# Patient Record
Sex: Male | Born: 1955 | Race: Black or African American | Hispanic: No | Marital: Single | Smoking: Former smoker
Health system: Southern US, Community
[De-identification: ages and names within clinical notes are randomized; demographics above are authoritative.]

## PROBLEM LIST (undated history)

## (undated) DIAGNOSIS — I609 Nontraumatic subarachnoid hemorrhage, unspecified: Secondary | ICD-10-CM

## (undated) HISTORY — DX: Nontraumatic subarachnoid hemorrhage, unspecified: I60.9

---

## 2014-06-14 ENCOUNTER — Emergency Department (HOSPITAL_COMMUNITY)
Admission: EM | Admit: 2014-06-14 | Discharge: 2014-06-14 | Disposition: A | Discharge status: Home / Self Care | Payer: Self-pay | Attending: Emergency Medicine | Admitting: Emergency Medicine

## 2014-06-14 ENCOUNTER — Emergency Department (HOSPITAL_COMMUNITY): Payer: Self-pay

## 2014-06-14 ENCOUNTER — Encounter (HOSPITAL_COMMUNITY): Payer: Self-pay | Admitting: *Deleted

## 2014-06-14 DIAGNOSIS — Z87891 Personal history of nicotine dependence: Secondary | ICD-10-CM | POA: Insufficient documentation

## 2014-06-14 DIAGNOSIS — Y998 Other external cause status: Secondary | ICD-10-CM | POA: Insufficient documentation

## 2014-06-14 DIAGNOSIS — S0101XA Laceration without foreign body of scalp, initial encounter: Secondary | ICD-10-CM | POA: Insufficient documentation

## 2014-06-14 DIAGNOSIS — Y9389 Activity, other specified: Secondary | ICD-10-CM | POA: Insufficient documentation

## 2014-06-14 DIAGNOSIS — Y92007 Garden or yard of unspecified non-institutional (private) residence as the place of occurrence of the external cause: Secondary | ICD-10-CM | POA: Insufficient documentation

## 2014-06-14 DIAGNOSIS — Z23 Encounter for immunization: Secondary | ICD-10-CM | POA: Insufficient documentation

## 2014-06-14 DIAGNOSIS — S51812A Laceration without foreign body of left forearm, initial encounter: Secondary | ICD-10-CM | POA: Insufficient documentation

## 2014-06-14 MED ORDER — CEFTRIAXONE SODIUM 1 G IJ SOLR
1.0000 g | Freq: Once | INTRAMUSCULAR | Status: AC
  Administered 2014-06-14: 1 g via INTRAMUSCULAR
  Filled 2014-06-14: qty 10

## 2014-06-14 MED ORDER — LIDOCAINE HCL (PF) 1 % IJ SOLN
INTRAMUSCULAR | Status: AC
  Administered 2014-06-14: 2.1 mL
  Filled 2014-06-14: qty 5

## 2014-06-14 MED ORDER — CEPHALEXIN 500 MG PO CAPS: 1000.0000 mg | ORAL_CAPSULE | Freq: Two times a day (BID) | ORAL | Status: DC

## 2014-06-14 MED ORDER — POVIDONE-IODINE 10 % EX SOLN
CUTANEOUS | Status: AC
  Administered 2014-06-14: 09:00:00
  Filled 2014-06-14: qty 118

## 2014-06-14 MED ORDER — TETANUS-DIPHTH-ACELL PERTUSSIS 5-2.5-18.5 LF-MCG/0.5 IM SUSP
0.5000 mL | Freq: Once | INTRAMUSCULAR | Status: AC
  Administered 2014-06-14: 0.5 mL via INTRAMUSCULAR
  Filled 2014-06-14: qty 0.5

## 2014-06-14 MED ORDER — HYDROCODONE-ACETAMINOPHEN 5-325 MG PO TABS: 1.0000 | ORAL_TABLET | ORAL | Status: DC | PRN

## 2014-06-14 MED ORDER — LIDOCAINE-EPINEPHRINE (PF) 1 %-1:200000 IJ SOLN
10.0000 mL | Freq: Once | INTRAMUSCULAR | Status: AC
  Administered 2014-06-14: 10 mL
  Filled 2014-06-14: qty 10
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
## 2014-06-14 NOTE — Discharge Instructions (Signed)
Delayed Wound Closure Sometimes, your health care provider will decide to delay closing a wound for several days. This is done when the wound is badly bruised, dirty, or when it has been several hours since the injury happened. By delaying the closure of your wound, the risk of infection is reduced. Wounds that are closed in 3-7 days after being cleaned up and dressed heal just as well as those that are closed right away. HOME CARE INSTRUCTIONS  Rest and elevate the injured area until the pain and swelling are gone.  Have your wound checked as instructed by your health care provider. SEEK MEDICAL CARE IF: Laceration Care, Adult A laceration is a cut or lesion that goes through all layers of the skin and into the tissue just beneath the skin. TREATMENT  Some lacerations may not require closure. Some lacerations may not be able to be closed due to an increased risk of infection. It is important to see your caregiver as soon as possible after an injury to minimize the risk of infection and maximize the opportunity for successful closure. If closure is appropriate, pain medicines may be given, if needed. The wound will be cleaned to help prevent infection. Your caregiver will use stitches (sutures), staples, wound glue (adhesive), or skin adhesive strips to repair the laceration. These tools bring the skin edges together to allow for faster healing and a better cosmetic outcome. However, all wounds will heal with a scar. Once the wound has healed, scarring can be minimized by covering the wound with sunscreen during the day for 1 full year. HOME CARE INSTRUCTIONS  For sutures or staples:  Keep the wound clean and dry.  If you were given a bandage (dressing), you should change it at least once a day. Also, change the dressing if it becomes wet or dirty, or as directed by your caregiver.  Wash the wound with soap and water 2 times a day. Rinse the wound off with water to remove all soap. Pat the wound  dry with a clean towel.  After cleaning, apply a thin layer of the antibiotic ointment as recommended by your caregiver. This will help prevent infection and keep the dressing from sticking.  You may shower as usual after the first 24 hours. Do not soak the wound in water until the sutures are removed.  Only take over-the-counter or prescription medicines for pain, discomfort, or fever as directed by your caregiver.  Get your sutures or staples removed as directed by your caregiver. For skin adhesive strips:  Keep the wound clean and dry.  Do not get the skin adhesive strips wet. You may bathe carefully, using caution to keep the wound dry.  If the wound gets wet, pat it dry with a clean towel.  Skin adhesive strips will fall off on their own. You may trim the strips as the wound heals. Do not remove skin adhesive strips that are still stuck to the wound. They will fall off in time. For wound adhesive:  You may briefly wet your wound in the shower or bath. Do not soak or scrub the wound. Do not swim. Avoid periods of heavy perspiration until the skin adhesive has fallen off on its own. After showering or bathing, gently pat the wound dry with a clean towel.  Do not apply liquid medicine, cream medicine, or ointment medicine to your wound while the skin adhesive is in place. This may loosen the film before your wound is healed.  If a dressing is  placed over the wound, be careful not to apply tape directly over the skin adhesive. This may cause the adhesive to be pulled off before the wound is healed.  Avoid prolonged exposure to sunlight or tanning lamps while the skin adhesive is in place. Exposure to ultraviolet light in the first year will darken the scar.  The skin adhesive will usually remain in place for 5 to 10 days, then naturally fall off the skin. Do not pick at the adhesive film. You may need a tetanus shot if:  You cannot remember when you had your last tetanus shot.  You  have never had a tetanus shot. If you get a tetanus shot, your arm may swell, get red, and feel warm to the touch. This is common and not a problem. If you need a tetanus shot and you choose not to have one, there is a rare chance of getting tetanus. Sickness from tetanus can be serious. SEEK MEDICAL CARE IF:   You have redness, swelling, or increasing pain in the wound.  You see a red line that goes away from the wound.  You have yellowish-white fluid (pus) coming from the wound.  You have a fever.  You notice a bad smell coming from the wound or dressing.  Your wound breaks open before or after sutures have been removed.  You notice something coming out of the wound such as wood or glass.  Your wound is on your hand or foot and you cannot move a finger or toe. SEEK IMMEDIATE MEDICAL CARE IF:   Your pain is not controlled with prescribed medicine.  You have severe swelling around the wound causing pain and numbness or a change in color in your arm, hand, leg, or foot.  Your wound splits open and starts bleeding.  You have worsening numbness, weakness, or loss of function of any joint around or beyond the wound.  You develop painful lumps near the wound or on the skin anywhere on your body. MAKE SURE YOU:   Understand these instructions.  Will watch your condition.  Will get help right away if you are not doing well or get worse. Document Released: 12/21/2004 Document Revised: 03/15/2011 Document Reviewed: 06/16/2010 [facility] Patient Information 2015 [place], [place]. This information is not intended to replace advice given to you by your health care provider. Make sure you discuss any questions you have with your health care provider.   You develop unusual or increased swelling or redness around the wound.  You have increasing pain or tenderness.  There is increasing fluid (drainage) or a bad smelling drainage coming from the wound. Document Released: 12/21/2004 Document  Revised: 12/26/2012 Document Reviewed: 06/20/2012 [facility] Patient Information 2015 [place], [place]. This information is not intended to replace advice given to you by your health care provider. Make sure you discuss any questions you have with your health care provider.  Take your entire course of antibodies prescribed.  Do not drive or operate any injuries equipment within 4 hours of taking the pain medication as this will make you sleepy.  As discussed, return here in 2 days for recheck of your injuries.  You may need to have your stitches changed at that time or if there are signs of infection you may need further treatment.

## 2014-06-14 NOTE — ED Notes (Signed)
Patient with no complaints at this time. Respirations even and unlabored. Skin warm/dry. Discharge instructions reviewed with patient at this time. Patient given opportunity to voice concerns/ask questions. Patient discharged at this time and left Emergency Department with steady gait.   

## 2014-06-16 ENCOUNTER — Encounter (HOSPITAL_COMMUNITY): Payer: Self-pay | Admitting: Emergency Medicine

## 2014-06-16 ENCOUNTER — Emergency Department (HOSPITAL_COMMUNITY)
Admission: EM | Admit: 2014-06-16 | Discharge: 2014-06-16 | Disposition: A | Discharge status: Home / Self Care | Payer: Self-pay | Attending: Emergency Medicine | Admitting: Emergency Medicine

## 2014-06-16 DIAGNOSIS — Z5189 Encounter for other specified aftercare: Secondary | ICD-10-CM

## 2014-06-16 DIAGNOSIS — Z792 Long term (current) use of antibiotics: Secondary | ICD-10-CM | POA: Insufficient documentation

## 2014-06-16 DIAGNOSIS — Z4801 Encounter for change or removal of surgical wound dressing: Secondary | ICD-10-CM | POA: Insufficient documentation

## 2014-06-16 DIAGNOSIS — Z87891 Personal history of nicotine dependence: Secondary | ICD-10-CM | POA: Insufficient documentation

## 2014-06-16 MED ORDER — CEPHALEXIN 500 MG PO CAPS
500.0000 mg | ORAL_CAPSULE | Freq: Once | ORAL | Status: AC
  Administered 2014-06-16: 500 mg via ORAL
  Filled 2014-06-16: qty 1

## 2014-06-16 NOTE — ED Provider Notes (Signed)
CSN: [id number]     Arrival date & time 06/16/14  1527 History   First MD Initiated Contact with Patient 06/16/14 1614     Chief Complaint  Patient presents with  . Wound Check     (Consider location/radiation/quality/duration/timing/severity/associated sxs/prior Treatment) HPI Barnet Mancil is a 59 y.o. male who presents to the ED for wound check. He reports that the area is still a little sore but less swollen and less painful. Denies other problems.   History reviewed. No pertinent past medical history. History reviewed. No pertinent past surgical history. History reviewed. No pertinent family history. History  Substance Use Topics  . Smoking status: Former Smoker    Types: Cigarettes    Quit date: 06/16/2011  . Smokeless tobacco: Not on file  . Alcohol Use: Yes     Comment: 3 beers daily    Review of Systems Negative except as stated in HPI   Allergies  Review of patient's allergies indicates no known allergies.  Home Medications   Prior to Admission medications   Medication Sig Start Date End Date Taking? Authorizing Provider  cephALEXin (KEFLEX) 500 MG capsule Take 2 capsules (1,000 mg total) by mouth 2 (two) times daily. 06/14/14   Burgess Amor, PA-C  HYDROcodone-acetaminophen (NORCO/VICODIN) 5-325 MG per tablet Take 1 tablet by mouth every 4 (four) hours as needed. 06/14/14   Burgess Amor, PA-C   BP 122/64 mmHg  Pulse 88  Temp(Src) 98.9 F (37.2 C) (Oral)  Resp 22  SpO2 99% Physical Exam  Constitutional: He is oriented to person, place, and time. He appears well-developed and well-nourished.  HENT:  Head: Normocephalic.  Staples in place left scalp area without signs of infection.   Eyes: EOM are normal.  Neck: Neck supple.  Cardiovascular: Normal rate.   Pulmonary/Chest: Effort normal.  Musculoskeletal: Normal range of motion.  Sutures in place left forearm, there is swelling surrounding the wound but no red streaking or increased warmth. I had J. Idol,  PA in to see the patient since she saw him initially and closed the wound 2 days ago. She states the wound has improved very well.   Neurological: He is alert and oriented to person, place, and time. No cranial nerve deficit.  Skin: Skin is warm and dry.  Psychiatric: He has a normal mood and affect. His behavior is normal.  Nursing note and vitals reviewed.   ED Course  Procedures (including critical care time) Labs Review  MDM  59 y.o. male here for wound recheck for 2 days prior. Stable for d/c without red streaking, fever or signs of infection. Encouraged patient to fill the Rx for his antibiotics and he agrees. He will return as scheduled for suture and staple removal or sooner for any problems.   Final diagnoses:  Encounter for wound re-check      Janne Napoleon, NP 06/16/14 1634  Raeford Razor, MD 06/17/14 1535
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
## 2014-06-16 NOTE — Discharge Instructions (Signed)
Be sure to get the antibiotic filled. Return in 7 days for suture removal. Return sooner for any problems.

## 2014-06-24 ENCOUNTER — Emergency Department (HOSPITAL_COMMUNITY)
Admission: EM | Admit: 2014-06-24 | Discharge: 2014-06-24 | Disposition: A | Discharge status: Home / Self Care | Payer: Self-pay

## 2014-06-24 NOTE — ED Notes (Signed)
No answer

## 2014-06-26 ENCOUNTER — Emergency Department (HOSPITAL_COMMUNITY)
Admission: EM | Admit: 2014-06-26 | Discharge: 2014-06-26 | Disposition: A | Discharge status: Home / Self Care | Payer: Self-pay | Attending: Emergency Medicine | Admitting: Emergency Medicine

## 2014-06-26 ENCOUNTER — Encounter (HOSPITAL_COMMUNITY): Payer: Self-pay | Admitting: Emergency Medicine

## 2014-06-26 DIAGNOSIS — Z87891 Personal history of nicotine dependence: Secondary | ICD-10-CM | POA: Insufficient documentation

## 2014-06-26 DIAGNOSIS — Z792 Long term (current) use of antibiotics: Secondary | ICD-10-CM | POA: Insufficient documentation

## 2014-06-26 DIAGNOSIS — L089 Local infection of the skin and subcutaneous tissue, unspecified: Secondary | ICD-10-CM | POA: Insufficient documentation

## 2014-06-26 DIAGNOSIS — Z4802 Encounter for removal of sutures: Secondary | ICD-10-CM | POA: Insufficient documentation

## 2014-06-26 DIAGNOSIS — T798XXA Other early complications of trauma, initial encounter: Secondary | ICD-10-CM

## 2014-06-26 NOTE — Discharge Instructions (Signed)
You need to make sure that you take the antibiotics to help the wound heal Staple Care and Removal Your caregiver has used staples today to repair your wound. Staples are used to help a wound heal faster by holding the edges of the wound together. The staples can be removed when the wound has healed well enough to stay together after the staples are removed. A dressing (wound covering), depending on the location of the wound, may have been applied. This may be changed once per day or as instructed. If the dressing sticks, it may be soaked off with soapy water or hydrogen peroxide. Only take over-the-counter or prescription medicines for pain, discomfort, or fever as directed by your caregiver.  If you did not receive a tetanus shot today because you did not recall when your last one was given, check with your caregiver when you have your staples removed to determine if one is needed. Return to your caregiver's office in 1 week or as suggested to have your staples removed. SEEK IMMEDIATE MEDICAL CARE IF:   You have redness, swelling, or increasing pain in the wound.  You have pus coming from the wound.  You have a fever.  You notice a bad smell coming from the wound or dressing.  Your wound edges break open after staples have been removed. Document Released: 09/15/2000 Document Revised: 03/15/2011 Document Reviewed: 09/30/2004 [facility] Patient Information 2015 [place], [place]. This information is not intended to replace advice given to you by your health care provider. Make sure you discuss any questions you have with your health care provider.

## 2014-06-26 NOTE — ED Notes (Signed)
Pt here for suture removal

## 2014-06-26 NOTE — ED Provider Notes (Signed)
CSN: [id number]     Arrival date & time 06/26/14  1525 History   First MD Initiated Contact with Patient 06/26/14 1538     Chief Complaint  Patient presents with  . Suture / Staple Removal     (Consider location/radiation/quality/duration/timing/severity/associated sxs/prior Treatment) HPI Comments: Pt states that he is here to have suture and staple removal. He had them placed 10 days ago. He states that his left forearm looks swollen. He hasn't been taking his antibiotics. He has staple in his scalp. Denies problem in the area.  The history is provided by the patient. No language interpreter was used.    History reviewed. No pertinent past medical history. History reviewed. No pertinent past surgical history. History reviewed. No pertinent family history. History  Substance Use Topics  . Smoking status: Former Smoker    Types: Cigarettes    Quit date: 06/16/2011  . Smokeless tobacco: Not on file  . Alcohol Use: Yes     Comment: 3 beers daily    Review of Systems  All other systems reviewed and are negative.     Allergies  Review of patient's allergies indicates no known allergies.  Home Medications   Prior to Admission medications   Medication Sig Start Date End Date Taking? Authorizing Provider  cephALEXin (KEFLEX) 500 MG capsule Take 2 capsules (1,000 mg total) by mouth 2 (two) times daily. 06/14/14   Burgess Amor, PA-C  HYDROcodone-acetaminophen (NORCO/VICODIN) 5-325 MG per tablet Take 1 tablet by mouth every 4 (four) hours as needed. 06/14/14   Burgess Amor, PA-C   BP 133/83 mmHg  Pulse 105  Temp(Src) 98.4 F (36.9 C) (Oral)  Resp 18  Ht 6' (1.829 m)  Wt 165 lb (74.844 kg)  BMI 22.37 kg/m2  SpO2 100% Physical Exam  Constitutional: He appears well-developed and well-nourished.  HENT:  Clean staples to the scalp. No drainage noted  Cardiovascular: Normal rate and regular rhythm.   Pulmonary/Chest: Effort normal and breath sounds normal.  Skin:  Wound to the  left forearm without redness or pus. Small fluctuance noted  Nursing note and vitals reviewed.   ED Course  SUTURE REMOVAL Date/Time: 06/26/2014 4:20 PM Performed by: Teressa Lower Authorized by: Teressa Lower Consent given by: patient Patient identity confirmed: verbally with patient Body area: head/neck Location details: scalp Staples Removed: 4 Facility: sutures placed in this facility Patient tolerance: Patient tolerated the procedure well with no immediate complications  SUTURE REMOVAL Date/Time: 06/26/2014 4:20 PM Performed by: Teressa Lower Authorized by: Teressa Lower Consent given by: patient Patient identity confirmed: verbally with patient Time out: Immediately prior to procedure a "time out" was called to verify the correct patient, procedure, equipment, support staff and site/side marked as required. Wound Appearance: draining Sutures Removed: 2 Post-removal: dressing applied Facility: sutures placed in this facility Patient tolerance: Patient tolerated the procedure well with no immediate complications   (including critical care time) Labs Review Labs Reviewed - No data to display  Imaging Review No results found.   EKG Interpretation None      MDM   Final diagnoses:  Visit for suture removal  Removal of staple  Infected wound, initial encounter    Forearm wound not well healed instructed pt to take the keflex that was given to him at the time the sutures were placed    Teressa Lower, NP 06/26/14 1622  Raeford Razor, MD 06/27/14 [phone]

## 2016-12-11 IMAGING — DX DG FOREARM 2V*L*
2 series · 2 of 2 positions shown · non-contrast
Comparison: None.

CLINICAL DATA: Forearm laceration.

EXAM:
LEFT FOREARM - 2 VIEW

[forearm ap]
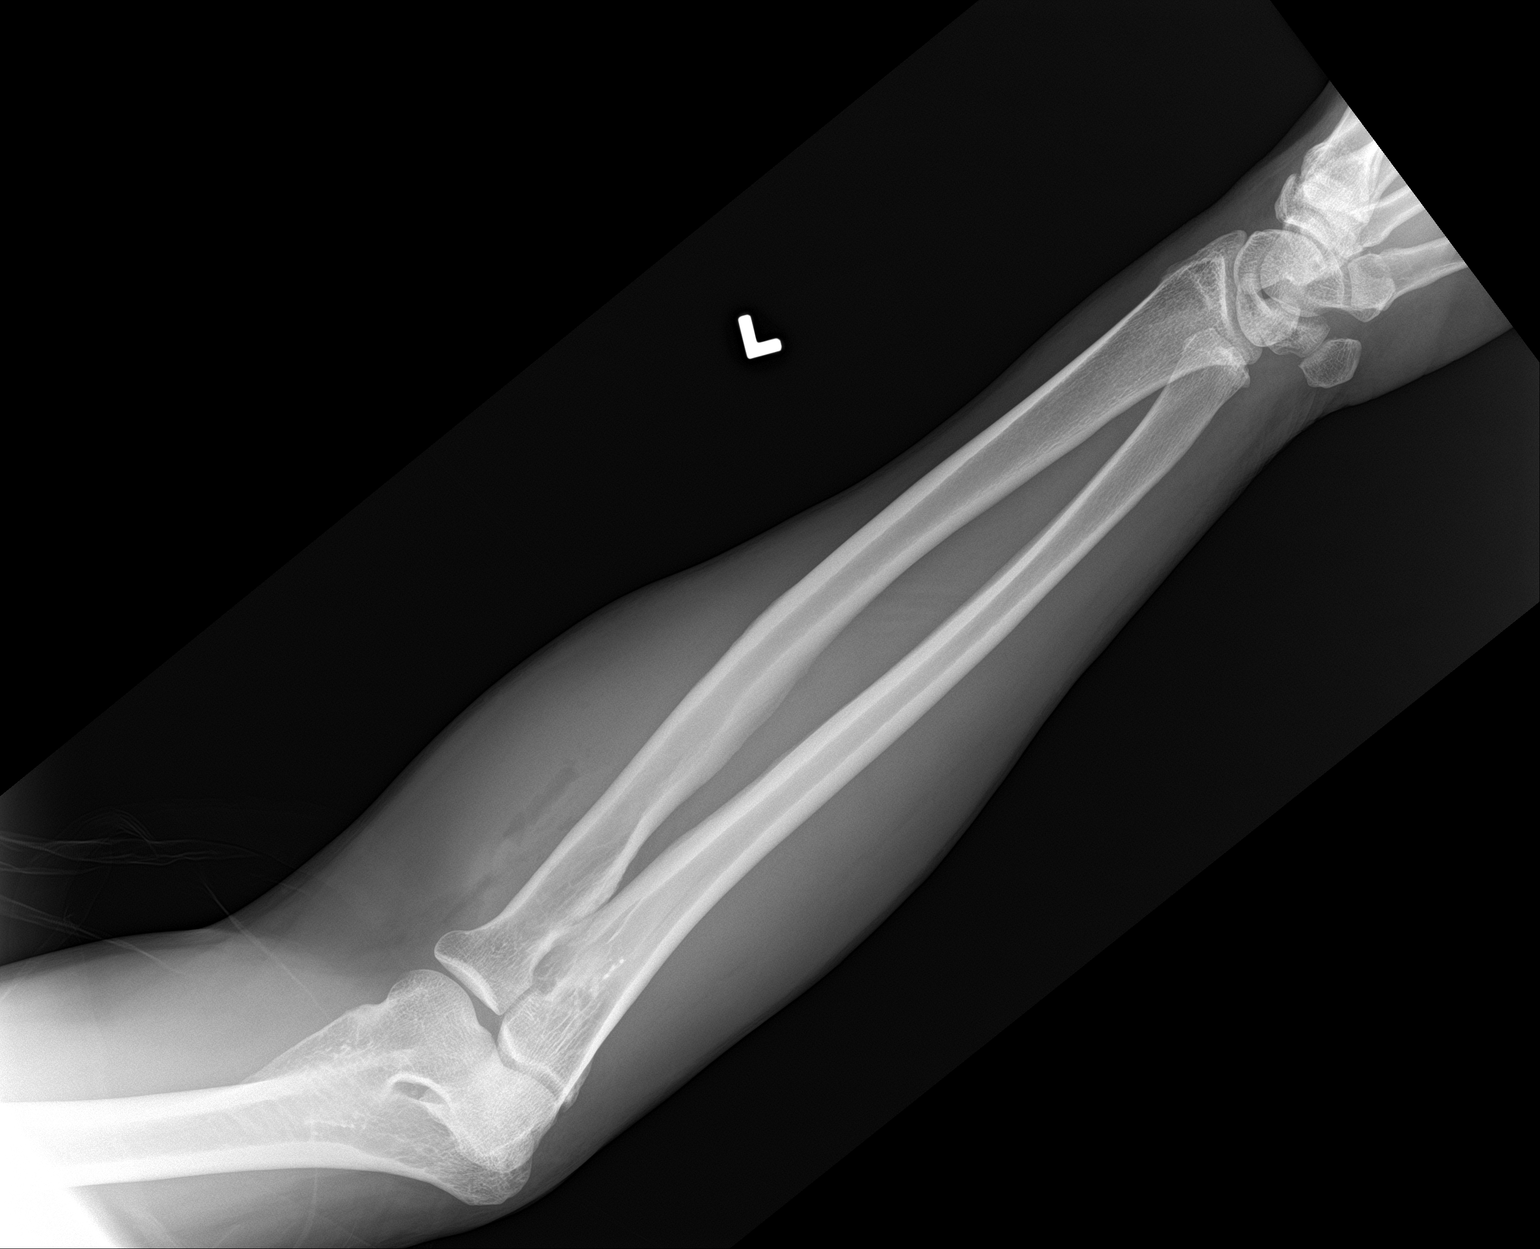

[forearm lat]
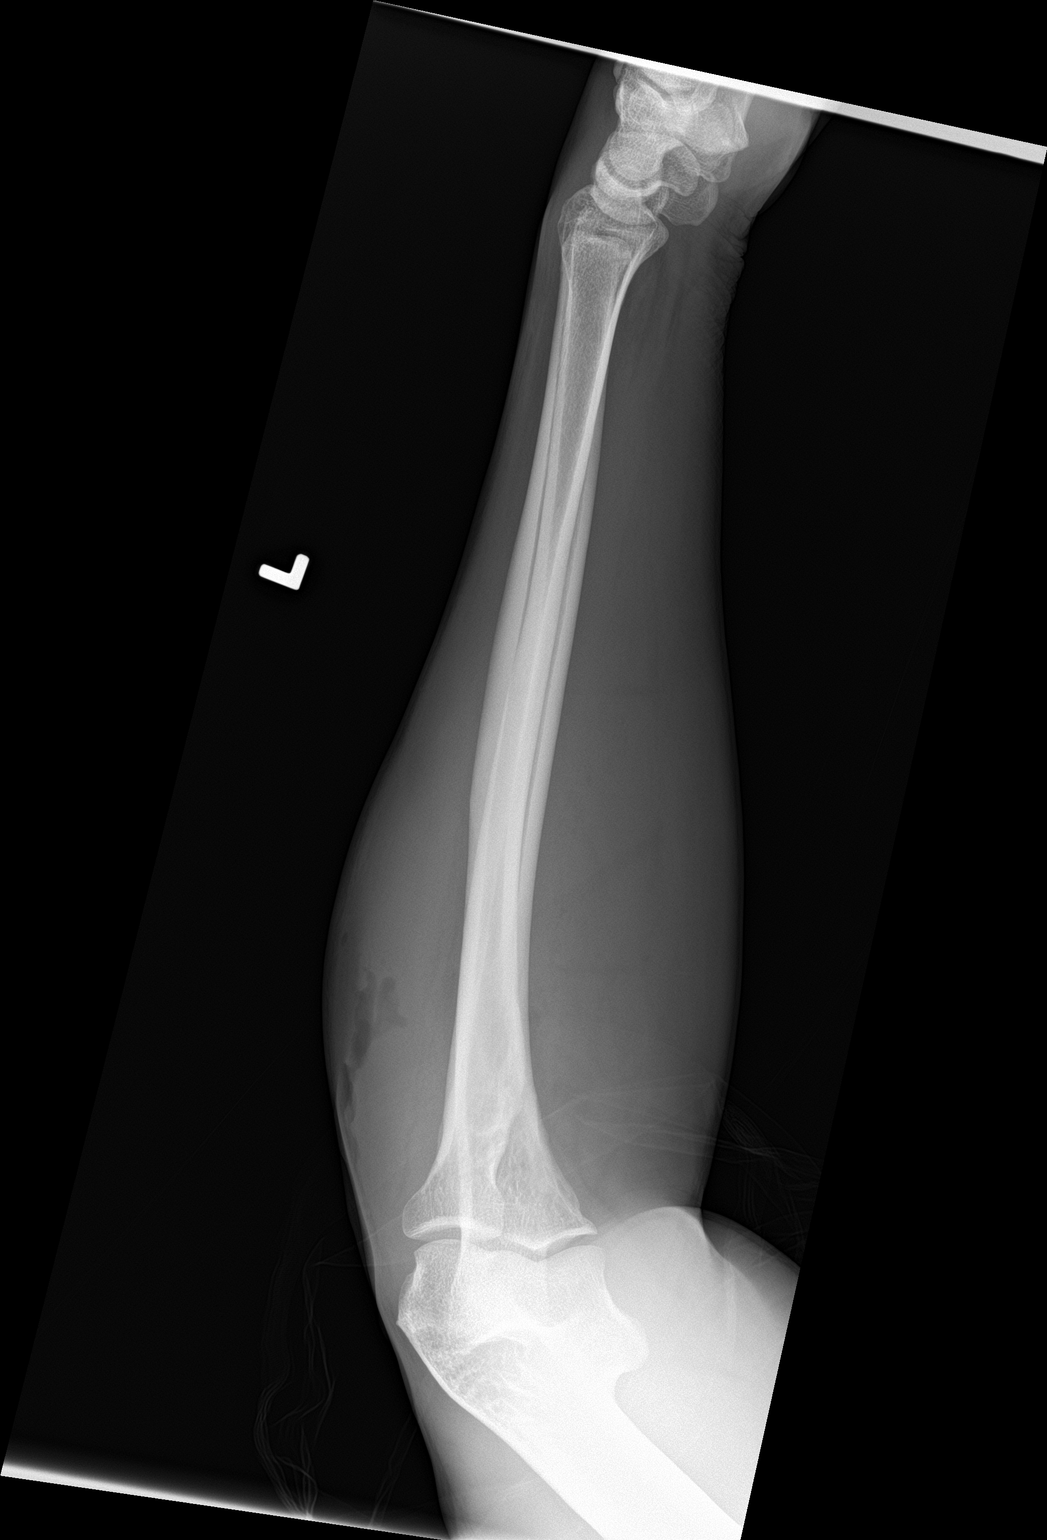

[2 of 2 positions shown; findings below may reference images not displayed]

FINDINGS: Soft tissue injury noted of the proximal forearm posteriorly.
Laceration is present. Soft tissue air noted. No acute bony
abnormality. No radiopaque foreign body.
IMPRESSION: Soft tissue laceration posterior proximal forearm. No acute bony
abnormality. No radiopaque foreign body.

## 2022-11-12 ENCOUNTER — Encounter: Payer: Self-pay | Admitting: Internal Medicine

## 2022-11-12 ENCOUNTER — Ambulatory Visit: Payer: Medicare Other | Admitting: Internal Medicine

## 2022-11-12 VITALS — BP 144/80 | HR 83 | Ht 73.0 in | Wt 161.4 lb

## 2022-11-12 DIAGNOSIS — M25572 Pain in left ankle and joints of left foot: Secondary | ICD-10-CM | POA: Diagnosis not present

## 2022-11-12 DIAGNOSIS — I10 Essential (primary) hypertension: Secondary | ICD-10-CM | POA: Diagnosis not present

## 2022-11-12 DIAGNOSIS — G8929 Other chronic pain: Secondary | ICD-10-CM | POA: Insufficient documentation

## 2022-11-12 DIAGNOSIS — Z114 Encounter for screening for human immunodeficiency virus [HIV]: Secondary | ICD-10-CM

## 2022-11-12 DIAGNOSIS — N401 Enlarged prostate with lower urinary tract symptoms: Secondary | ICD-10-CM

## 2022-11-12 DIAGNOSIS — Z1159 Encounter for screening for other viral diseases: Secondary | ICD-10-CM

## 2022-11-12 DIAGNOSIS — E559 Vitamin D deficiency, unspecified: Secondary | ICD-10-CM

## 2022-11-12 DIAGNOSIS — E782 Mixed hyperlipidemia: Secondary | ICD-10-CM

## 2022-11-12 DIAGNOSIS — R351 Nocturia: Secondary | ICD-10-CM

## 2022-11-12 DIAGNOSIS — S066X0D Traumatic subarachnoid hemorrhage without loss of consciousness, subsequent encounter: Secondary | ICD-10-CM | POA: Diagnosis not present

## 2022-11-12 DIAGNOSIS — R739 Hyperglycemia, unspecified: Secondary | ICD-10-CM

## 2022-11-12 DIAGNOSIS — S066X0A Traumatic subarachnoid hemorrhage without loss of consciousness, initial encounter: Secondary | ICD-10-CM | POA: Insufficient documentation

## 2022-11-12 DIAGNOSIS — F1010 Alcohol abuse, uncomplicated: Secondary | ICD-10-CM | POA: Insufficient documentation

## 2022-11-12 MED ORDER — TAMSULOSIN HCL 0.4 MG PO CAPS: 0.4000 mg | ORAL_CAPSULE | Freq: Every day | ORAL | 3 refills | Status: AC

## 2022-11-12 MED ORDER — AMLODIPINE BESYLATE 5 MG PO TABS: 5.0000 mg | ORAL_TABLET | Freq: Every day | ORAL | 2 refills | Status: AC

## 2022-11-12 NOTE — Assessment & Plan Note (Signed)
BP Readings from Last 1 Encounters:  11/12/22 (!) 144/80   Uncontrolled, new onset Started amlodipine 5 mg QD Counseled for compliance with the medications Advised DASH diet and moderate exercise/walking, at least 150 mins/week

## 2022-11-12 NOTE — Patient Instructions (Signed)
Please start taking Amlodipine for blood pressure.  Please start taking Tamsulosin for urinary symptoms.
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
## 2022-11-12 NOTE — Assessment & Plan Note (Signed)
Needs to cut down alcohol to reduce fall risk, he expressed understanding Advised to take B complex vitamin supplement

## 2022-11-12 NOTE — Assessment & Plan Note (Signed)
Left ankle pain likely due to overexertion or prolonged standing, could be ankle arthritis Trial of Tylenol arthritis as needed for pain Leg elevation as tolerated If persistent, will get x-ray of ankle

## 2022-11-12 NOTE — Assessment & Plan Note (Signed)
Has chronic urinary urgency and nocturia, likely due to BPH Check PSA Started tamsulosin 0.4 mg qHS

## 2022-11-13 LAB — CMP14+EGFR
ALT: 22 [IU]/L (ref 0–44)
AST: 56 [IU]/L — ABNORMAL HIGH (ref 0–40)
Albumin: 4.2 g/dL (ref 3.9–4.9)
Alkaline Phosphatase: 95 [IU]/L (ref 44–121)
BUN/Creatinine Ratio: 14 (ref 10–24)
BUN: 12 mg/dL (ref 8–27)
Bilirubin Total: 0.3 mg/dL (ref 0.0–1.2)
CO2: 22 mmol/L (ref 20–29)
Calcium: 8.9 mg/dL (ref 8.6–10.2)
Chloride: 104 mmol/L (ref 96–106)
Creatinine, Ser: 0.86 mg/dL (ref 0.76–1.27)
Globulin, Total: 3.2 g/dL (ref 1.5–4.5)
Glucose: 95 mg/dL (ref 70–99)
Potassium: 4.5 mmol/L (ref 3.5–5.2)
Sodium: 139 mmol/L (ref 134–144)
Total Protein: 7.4 g/dL (ref 6.0–8.5)
eGFR: 96 mL/min/{1.73_m2} (ref >59)

## 2022-11-13 LAB — LIPID PANEL
Chol/HDL Ratio: 3.1 ratio (ref 0.0–5.0)
Cholesterol, Total: 168 mg/dL (ref 100–199)
HDL: 54 mg/dL (ref >39)
LDL Chol Calc (NIH): 72 mg/dL (ref 0–99)
Triglycerides: 262 mg/dL — ABNORMAL HIGH (ref 0–149)
VLDL Cholesterol Cal: 42 mg/dL — ABNORMAL HIGH (ref 5–40)

## 2022-11-13 LAB — CBC WITH DIFFERENTIAL/PLATELET
Basophils Absolute: 0 10*3/uL (ref 0.0–0.2)
Basos: 1 %
EOS (ABSOLUTE): 0.2 10*3/uL (ref 0.0–0.4)
Eos: 5 %
Hematocrit: 35.1 % — ABNORMAL LOW (ref 37.5–51.0)
Hemoglobin: 10.2 g/dL — ABNORMAL LOW (ref 13.0–17.7)
Immature Grans (Abs): 0 10*3/uL (ref 0.0–0.1)
Immature Granulocytes: 0 %
Lymphocytes Absolute: 1.3 10*3/uL (ref 0.7–3.1)
Lymphs: 28 %
MCH: 23.8 pg — ABNORMAL LOW (ref 26.6–33.0)
MCHC: 29.1 g/dL — ABNORMAL LOW (ref 31.5–35.7)
MCV: 82 fL (ref 79–97)
Monocytes Absolute: 0.6 10*3/uL (ref 0.1–0.9)
Monocytes: 14 %
Neutrophils Absolute: 2.5 10*3/uL (ref 1.4–7.0)
Neutrophils: 52 %
Platelets: 328 10*3/uL (ref 150–450)
RBC: 4.28 x10E6/uL (ref 4.14–5.80)
RDW: 16.6 % — ABNORMAL HIGH (ref 11.6–15.4)
WBC: 4.7 10*3/uL (ref 3.4–10.8)

## 2022-11-13 LAB — TSH: TSH: 1.08 u[IU]/mL (ref 0.450–4.500)

## 2022-11-13 LAB — HEMOGLOBIN A1C
Est. average glucose Bld gHb Est-mCnc: 111 mg/dL
Hgb A1c MFr Bld: 5.5 % (ref 4.8–5.6)

## 2022-11-13 LAB — HIV ANTIBODY (ROUTINE TESTING W REFLEX): HIV Screen 4th Generation wRfx: NONREACTIVE

## 2022-11-13 LAB — HEPATITIS C ANTIBODY: Hep C Virus Ab: NONREACTIVE

## 2022-11-13 LAB — PSA: Prostate Specific Ag, Serum: 4.8 ng/mL — ABNORMAL HIGH (ref 0.0–4.0)

## 2022-11-13 LAB — VITAMIN D 25 HYDROXY (VIT D DEFICIENCY, FRACTURES): Vit D, 25-Hydroxy: 6.9 ng/mL — ABNORMAL LOW (ref 30.0–100.0)

## 2022-12-07 ENCOUNTER — Other Ambulatory Visit: Payer: Self-pay

## 2022-12-07 ENCOUNTER — Telehealth: Payer: Self-pay

## 2022-12-07 NOTE — Congregational Nurse Program (Unsigned)
  Dept: [phone]   Congregational Nurse Program Note  Date of Encounter: 12/07/2022  Past Medical History: No past medical history on file.  Encounter Details:

## 2022-12-07 NOTE — Congregational Nurse Program (Unsigned)
Ryan Carter walked into the [place] Dept: [phone]   Congregational Nurse Program Note  Date of Encounter: 12/07/2022  Past Medical History: No past medical history on file.  Encounter Details:  Ryan Carter reported to the [facility] to request a blood pressure assessment. He has a documented history of hypertension and indicated that he has consulted with a primary care physician. During this consultation, he was prescribed antihypertensive medication, which he reports has been taken as directed. Nonetheless, his blood pressure readings remain elevated. Ryan Carter reported to the [facility] [employer] to request a blood pressure assessment. He has a documented history of hypertension and indicated that he has consulted with a primary care physician. During this consultation, he was prescribed antihypertensive medication, which he reports has been taken as directed. Nonetheless, his blood pressure readings remain elevated.

## 2023-01-07 ENCOUNTER — Telehealth: Payer: Self-pay | Admitting: Internal Medicine

## 2023-01-07 NOTE — Telephone Encounter (Signed)
 Called patient to follow up with Dr Allena Katz for bloodwork called no answer
